# Patient Record
Sex: Male | Born: 1950 | Race: White | Hispanic: No | Marital: Married | Smoking: Never smoker
Health system: Southern US, Community
[De-identification: ages and names within clinical notes are randomized; demographics above are authoritative.]

## PROBLEM LIST (undated history)

## (undated) DIAGNOSIS — I10 Essential (primary) hypertension: Secondary | ICD-10-CM

## (undated) DIAGNOSIS — M109 Gout, unspecified: Secondary | ICD-10-CM

## (undated) HISTORY — DX: Essential (primary) hypertension: I10

---

## 2019-08-02 ENCOUNTER — Emergency Department
Admission: EM | Admit: 2019-08-02 | Discharge: 2019-08-02 | Disposition: A | Discharge status: Home / Self Care | Payer: Medicare HMO | Source: Home / Self Care

## 2019-08-02 ENCOUNTER — Emergency Department: Payer: Medicare HMO

## 2019-08-02 ENCOUNTER — Other Ambulatory Visit: Payer: Self-pay

## 2019-08-02 ENCOUNTER — Emergency Department (INDEPENDENT_AMBULATORY_CARE_PROVIDER_SITE_OTHER): Payer: Medicare HMO

## 2019-08-02 DIAGNOSIS — S92501A Displaced unspecified fracture of right lesser toe(s), initial encounter for closed fracture: Secondary | ICD-10-CM | POA: Diagnosis not present

## 2019-08-02 DIAGNOSIS — S99921A Unspecified injury of right foot, initial encounter: Secondary | ICD-10-CM

## 2019-08-02 NOTE — ED Provider Notes (Signed)
[facility]    CSN: [id number] Arrival date & time: 08/02/19  1212      History   Chief Complaint Chief Complaint  Patient presents with  . Toe Pain    HPI Hank Walling is a 69 y.o. male.   HPI Aladdin Kollmann is a 69 y.o. male presenting to UC with c/o pain, swelling and bruising to Right 2nd toe that occurred two days ago after bending it "in a funny direction" while in the pool.  He has buddy taped his toe, pain is 6/10. His wife encouraged him to be evaluated for a fracture today.   Past Medical History:  Diagnosis Date  . Hypertension     There are no problems to display for this patient.   History reviewed. No pertinent surgical history.     Home Medications    Prior to Admission medications   Medication Sig Start Date End Date Taking? Authorizing Provider  amLODipine (NORVASC) 10 MG tablet Take 10 mg by mouth daily.   Yes [provider]  atorvastatin (LIPITOR) 40 MG tablet Take 40 mg by mouth daily.   Yes [provider]  indomethacin (INDOCIN) 50 MG capsule Take 50 mg by mouth 2 (two) times daily with a meal.   Yes [provider]  losartan (COZAAR) 50 MG tablet Take 50 mg by mouth daily.   Yes [provider]    Family History Family History  Problem Relation Age of Onset  . Healthy Mother   . Hypertension Mother   . Heart attack Father   . Hypertension Father     Social History Social History   Tobacco Use  . Smoking status: Never Smoker  . Smokeless tobacco: Never Used  Substance Use Topics  . Alcohol use: Yes    Alcohol/week: 2.0 standard drinks    Types: 2 Glasses of wine per week  . Drug use: Not on file     Allergies   Patient has no known allergies.   Review of Systems Review of Systems  Musculoskeletal: Positive for arthralgias and joint swelling.  Skin: Positive for color change. Negative for wound.     Physical Exam Triage Vital Signs ED Triage Vitals  Enc Vitals Group       BP 08/02/19 1232 (!) 161/93     Pulse Rate 08/02/19 1232 60     Resp 08/02/19 1232 18     Temp 08/02/19 1232 98.1 F (36.7 C)     Temp Source 08/02/19 1232 Oral     SpO2 08/02/19 1232 100 %     Weight --      Height --      Head Circumference --      Peak Flow --      Pain Score 08/02/19 1229 6     Pain Loc --      Pain Edu? --      Excl. in GC? --    No data found.  Updated Vital Signs BP (!) 161/93 (BP Location: Right Arm)   Pulse 60   Temp 98.1 F (36.7 C) (Oral)   Resp 18   SpO2 100%   Visual Acuity Right Eye Distance:   Left Eye Distance:   Bilateral Distance:    Right Eye Near:   Left Eye Near:    Bilateral Near:     Physical Exam Vitals and nursing note reviewed.  Constitutional:      Appearance: Normal appearance. He is well-developed.  HENT:  Head: Normocephalic and atraumatic.  Cardiovascular:     Rate and Rhythm: Normal rate.  Pulmonary:     Effort: Pulmonary effort is normal.  Musculoskeletal:        General: Swelling and tenderness present. Normal range of motion.     Cervical back: Normal range of motion.     Comments: Right second toe: mild edema, tenderness. No tenderness to metatarsals. Full ROM all toes.  Skin:    General: Skin is warm and dry.     Comments: Right second toe: superficial abrasion to dorsal aspect, surrounding ecchymosis.   Neurological:     Mental Status: He is alert and oriented to person, place, and time.  Psychiatric:        Behavior: Behavior normal.      UC Treatments / Results  Labs (all labs ordered are listed, but only abnormal results are displayed) Labs Reviewed - No data to display  EKG   Radiology DG Foot Complete Right  Result Date: 08/02/2019 CLINICAL DATA:  Toe injury, bruising EXAM: RIGHT FOOT COMPLETE - 3+ VIEW COMPARISON:  None. FINDINGS: Oblique nondisplaced intra-articular fracture of the distal aspect of the right second proximal phalanx. No other fracture or dislocation. Mild first  metatarsophalangeal arthrosis. Soft tissues are unremarkable. IMPRESSION: 1. Oblique nondisplaced intra-articular fracture of the distal aspect of the right second proximal phalanx. No other fracture or dislocation. 2. Mild first metatarsophalangeal arthrosis. Electronically Signed   By: Lauralyn Primes M.D.   On: 08/02/2019 13:16    Procedures Procedures (including critical care time)  Medications Ordered in UC Medications - No data to display  Initial Impression / Assessment and Plan / UC Course  I have reviewed the triage vital signs and the nursing notes.  Pertinent labs & imaging results that were available during my care of the patient were reviewed by me and considered in my medical decision making (see chart for details).    Discussed imaging with pt Toes strapped using buddy taping technique by nursing staff. F/u with PCP or Sports medicine in 1-2 weeks as needed AVS given  Final Clinical Impressions(s) / UC Diagnoses   Final diagnoses:  Injury of right toe, initial encounter  Closed fracture of phalanx of right second toe, initial encounter     Discharge Instructions      You can continue to buddy tape and take tylenol and motrin as needed for pain.   Call to schedule a follow up appointment with Sports Medicine in 1-2 weeks if needed.     ED Prescriptions    None     PDMP not reviewed this encounter.   Lurene Shadow, [place] 08/02/19 1531

## 2019-08-02 NOTE — Discharge Instructions (Signed)
  You can continue to buddy tape and take tylenol and motrin as needed for pain.   Call to schedule a follow up appointment with Sports Medicine in 1-2 weeks if needed.

## 2019-08-02 NOTE — ED Triage Notes (Signed)
Patient presents to Urgent Care with complaints of right second toe pain since bending it a funny direction in the pool two days ago. Patient reports his wife told him to come see if it was broken, pt is able to walk on it, redness and swelling noted. Buddy taped his toes at home.

## 2020-06-22 ENCOUNTER — Other Ambulatory Visit: Payer: Self-pay

## 2020-06-22 ENCOUNTER — Emergency Department (INDEPENDENT_AMBULATORY_CARE_PROVIDER_SITE_OTHER): Payer: Medicare HMO

## 2020-06-22 ENCOUNTER — Emergency Department
Admission: EM | Admit: 2020-06-22 | Discharge: 2020-06-22 | Disposition: A | Discharge status: Home / Self Care | Payer: Medicare HMO | Source: Home / Self Care

## 2020-06-22 ENCOUNTER — Encounter: Payer: Self-pay | Admitting: Emergency Medicine

## 2020-06-22 DIAGNOSIS — W19XXXA Unspecified fall, initial encounter: Secondary | ICD-10-CM

## 2020-06-22 DIAGNOSIS — S6991XA Unspecified injury of right wrist, hand and finger(s), initial encounter: Secondary | ICD-10-CM | POA: Diagnosis not present

## 2020-06-22 DIAGNOSIS — S63501A Unspecified sprain of right wrist, initial encounter: Secondary | ICD-10-CM

## 2020-06-22 NOTE — Discharge Instructions (Signed)
Return if any problems.

## 2020-06-22 NOTE — ED Triage Notes (Signed)
Pt was doing yard work earlier today - went to switch out chainsaw & ladder was knocked out from under pt - pt fell approx 4 ft and landed on R wrist - weakness to R wrist/hand grip since then  (1400)  Tylenol 1000mg  at 

## 2020-06-24 NOTE — ED Provider Notes (Signed)
[facility]    CSN: [id number] Arrival date & time: 06/22/20  1707      History   Chief Complaint Chief Complaint  Patient presents with  . Wrist Injury    HPI Todd Fernandez is a 70 y.o. male.   The history is provided by the patient. No language interpreter was used.  Wrist Injury Location:  Wrist Wrist location:  R wrist Injury: yes   Time since incident:  1 day Pain details:    Quality:  Aching   Radiates to:  Does not radiate   Severity:  Moderate   Onset quality:  Gradual   Timing:  Constant Relieved by:  Nothing Pt reports wrist twisted.  Pt complains of swelling  Past Medical History:  Diagnosis Date  . Hypertension     There are no problems to display for this patient.   History reviewed. No pertinent surgical history.     Home Medications    Prior to Admission medications   Medication Sig Start Date End Date Taking? Authorizing Provider  amLODipine (NORVASC) 10 MG tablet Take 10 mg by mouth daily.   Yes [provider]  losartan (COZAAR) 50 MG tablet Take 50 mg by mouth daily.   Yes [provider]  atorvastatin (LIPITOR) 40 MG tablet Take 40 mg by mouth daily.    [provider]  indomethacin (INDOCIN) 50 MG capsule Take 50 mg by mouth 2 (two) times daily with a meal.    [provider]    Family History Family History  Problem Relation Age of Onset  . Healthy Mother   . Hypertension Mother   . Heart attack Father   . Hypertension Father     Social History Social History   Tobacco Use  . Smoking status: Never Smoker  . Smokeless tobacco: Never Used  Substance Use Topics  . Alcohol use: Yes    Alcohol/week: 2.0 standard drinks    Types: 2 Glasses of wine per week  . Drug use: Never     Allergies   Patient has no known allergies.   Review of Systems Review of Systems  All other systems reviewed and are negative.    Physical Exam Triage Vital Signs ED Triage Vitals  Enc  Vitals Group     BP 06/22/20 1730 (!) 164/90     Pulse Rate 06/22/20 1730 67     Resp 06/22/20 1730 15     Temp 06/22/20 1730 99.3 F (37.4 C)     Temp Source 06/22/20 1730 Oral     SpO2 06/22/20 1730 97 %     Weight --      Height --      Head Circumference --      Peak Flow --      Pain Score 06/22/20 1736 3     Pain Loc --      Pain Edu? --      Excl. in GC? --    No data found.  Updated Vital Signs BP (!) 164/90 (BP Location: Left Arm) Comment: HTN hx - on meds  Pulse 67   Temp 99.3 F (37.4 C) (Oral)   Resp 15   SpO2 97%   Visual Acuity Right Eye Distance:   Left Eye Distance:   Bilateral Distance:    Right Eye Near:   Left Eye Near:    Bilateral Near:     Physical Exam Vitals reviewed.  Musculoskeletal:  General: Swelling and tenderness present.  Skin:    General: Skin is warm.  Neurological:     General: No focal deficit present.     Mental Status: He is alert.  Psychiatric:        Mood and Affect: Mood normal.      UC Treatments / Results  Labs (all labs ordered are listed, but only abnormal results are displayed) Labs Reviewed - No data to display  EKG   Radiology DG Wrist Complete Right  Result Date: 06/22/2020 CLINICAL DATA:  Fall on RIGHT wrist from 4 feet. Range of motion limited by report. EXAM: RIGHT WRIST - COMPLETE 3+ VIEW COMPARISON:  None FINDINGS: Small bone fragments adjacent to the radial styloid. No visible fracture of the radial articular surface or carpal bones. mild undulation of the distal pole the scaphoid may represent a normal variant. Distal radioulnar joint appears intact. No significant soft tissue swelling about the distal forearm. Some soft tissue swelling may be present anterior to the proximal carpal row and distal radius. Mild dorsal tilt of the lunate. IMPRESSION: Small bone fragments adjacent to the radial styloid. This could be the result of an avulsion injury or direct impact. No definitive signs of  articular surface involvement. Mild irregularity along the distal pole of the scaphoid along the radial margin may simply be normal variant. Could consider short interval follow-up if there is point tenderness in this area. Electronically Signed   By: Donzetta Kohut M.D.   On: 06/22/2020 18:13    Procedures Procedures (including critical care time)  Medications Ordered in UC Medications - No data to display  Initial Impression / Assessment and Plan / UC Course  I have reviewed the triage vital signs and the nursing notes.  Pertinent labs & imaging results that were available during my care of the patient were reviewed by me and considered in my medical decision making (see chart for details).     MDM: Pt counseled on wrist sprain.  Pt placed in a splint and advised to follow up with Sports medicine  Final Clinical Impressions(s) / UC Diagnoses   Final diagnoses:  Sprain of right wrist, initial encounter     Discharge Instructions     Return if any problems.     ED Prescriptions    None     PDMP not reviewed this encounter.  An After Visit Summary was printed and given to the patient.    Elson Areas, [place] 06/24/20 1337

## 2020-08-23 ENCOUNTER — Other Ambulatory Visit: Payer: Self-pay

## 2020-08-23 ENCOUNTER — Emergency Department (INDEPENDENT_AMBULATORY_CARE_PROVIDER_SITE_OTHER): Payer: Medicare HMO

## 2020-08-23 ENCOUNTER — Emergency Department
Admission: EM | Admit: 2020-08-23 | Discharge: 2020-08-23 | Disposition: A | Discharge status: Home / Self Care | Payer: Medicare HMO | Source: Home / Self Care | Attending: Family Medicine | Admitting: Family Medicine

## 2020-08-23 DIAGNOSIS — M79641 Pain in right hand: Secondary | ICD-10-CM | POA: Diagnosis not present

## 2020-08-23 DIAGNOSIS — W19XXXA Unspecified fall, initial encounter: Secondary | ICD-10-CM

## 2020-08-23 DIAGNOSIS — S63616A Unspecified sprain of right little finger, initial encounter: Secondary | ICD-10-CM

## 2020-08-23 HISTORY — DX: Gout, unspecified: M10.9

## 2020-08-23 NOTE — Discharge Instructions (Addendum)
Use ice and elevation for the swelling May take ibuprofen for pain Sprained fingers can take months to heal completely

## 2020-08-23 NOTE — ED Triage Notes (Signed)
Pt presents to Urgent Care with c/o R pinky and R lateral hand pain and swelling following a fall last night. States he slipped and fell, landing on his R hand. Pinky is noted to be swollen and bruised.

## 2020-08-23 NOTE — ED Provider Notes (Signed)
[facility]    CSN: [id number] Arrival date & time: 08/23/20  1116      History   Chief Complaint Chief Complaint  Patient presents with   Hand Pain    Mostly to R pinky finger    HPI Todd Fernandez is a 70 y.o. male.   HPI  Patient had a fall in his home last night.  He injured his right hand.  He states it was sore last night and he did not think much of it.  This morning he is noted his fifth finger is swollen, discolored, and it is more painful especially with bending.  He is otherwise uninjured  Past Medical History:  Diagnosis Date   Gout    Hypertension     There are no problems to display for this patient.   History reviewed. No pertinent surgical history.     Home Medications    Prior to Admission medications   Medication Sig Start Date End Date Taking? Authorizing Provider  acetaminophen (TYLENOL) 325 MG tablet Take 650 mg by mouth every 6 (six) hours as needed.   Yes [provider]  amLODipine (NORVASC) 10 MG tablet Take 10 mg by mouth daily.    [provider]  atorvastatin (LIPITOR) 40 MG tablet Take 40 mg by mouth daily.    [provider]  indomethacin (INDOCIN) 50 MG capsule Take 50 mg by mouth 2 (two) times daily as needed. For gout flare-up    [provider]  losartan (COZAAR) 50 MG tablet Take 50 mg by mouth daily.    [provider]    Family History Family History  Problem Relation Age of Onset   Healthy Mother    Hypertension Mother    Heart attack Father    Hypertension Father     Social History Social History   Tobacco Use   Smoking status: Never   Smokeless tobacco: Never  Vaping Use   Vaping Use: Never used  Substance Use Topics   Alcohol use: Not Currently    Alcohol/week: 4.0 standard drinks    Types: 4 Glasses of wine per week    Comment: weekly   Drug use: Never     Allergies   Patient has no known allergies.   Review of Systems Review of Systems See  HPI  Physical Exam Triage Vital Signs ED Triage Vitals  Enc Vitals Group     BP 08/23/20 1133 (!) 169/88     Pulse Rate 08/23/20 1133 65     Resp 08/23/20 1133 18     Temp 08/23/20 1133 99.3 F (37.4 C)     Temp Source 08/23/20 1133 Oral     SpO2 08/23/20 1133 97 %     Weight 08/23/20 1128 190 lb (86.2 kg)     Height 08/23/20 1128 6' (1.829 m)     Head Circumference --      Peak Flow --      Pain Score 08/23/20 1128 5     Pain Loc --      Pain Edu? --      Excl. in GC? --    No data found.  Updated Vital Signs BP (!) 163/89 (BP Location: Right Arm)   Pulse 65   Temp 99.3 F (37.4 C) (Oral)   Resp 18   Ht 6' (1.829 m)   Wt 86.2 kg   SpO2 97%   BMI 25.77 kg/m    Physical Exam Constitutional:  General: He is not in acute distress.    Appearance: He is well-developed.  HENT:     Head: Normocephalic and atraumatic.  Eyes:     Conjunctiva/sclera: Conjunctivae normal.     Pupils: Pupils are equal, round, and reactive to light.  Cardiovascular:     Rate and Rhythm: Normal rate.  Pulmonary:     Effort: Pulmonary effort is normal. No respiratory distress.  Abdominal:     General: There is no distension.     Palpations: Abdomen is soft.  Musculoskeletal:        General: Normal range of motion.     Cervical back: Normal range of motion.     Comments: Fifth finger on right hand is diffusely swollen.  On the palmar aspect ecchymosis is noted in all of the IP creases.  Sensation is normal.  Skin:    General: Skin is warm and dry.  Neurological:     Mental Status: He is alert.       UC Treatments / Results  Labs (all labs ordered are listed, but only abnormal results are displayed) Labs Reviewed - No data to display  EKG   Radiology DG Hand Complete Right  Result Date: 08/23/2020 CLINICAL DATA:  Right hand pain, history of fall EXAM: RIGHT HAND - COMPLETE 3+ VIEW COMPARISON:  X-ray right wrist dated Jun 22, 2020 FINDINGS: No acute fracture or  dislocation. No aggressive-appearing focal osseous lesions. Mild-to-moderate degenerative changes the IP joints. IMPRESSION: No acute osseous abnormality. Electronically Signed   By: Allegra Lai MD   On: 08/23/2020 12:30    Procedures Procedures (including critical care time)  Medications Ordered in UC Medications - No data to display  Initial Impression / Assessment and Plan / UC Course  I have reviewed the triage vital signs and the nursing notes.  Pertinent labs & imaging results that were available during my care of the patient were reviewed by me and considered in my medical decision making (see chart for details).      Final Clinical Impressions(s) / UC Diagnoses   Final diagnoses:  Sprain of right little finger, unspecified site of digit, initial encounter     Discharge Instructions      Use ice and elevation for the swelling May take ibuprofen for pain Sprained fingers can take months to heal completely     ED Prescriptions   None    PDMP not reviewed this encounter.   Eustace Moore, MD 08/23/20 [phone]

## 2022-08-05 IMAGING — DX DG WRIST COMPLETE 3+V*R*
4 series · 4 of 4 positions shown · non-contrast
Comparison: None

CLINICAL DATA: Fall on RIGHT wrist from 4 feet. Range of motion
limited by report.

EXAM:
RIGHT WRIST - COMPLETE 3+ VIEW

[wrist pa]
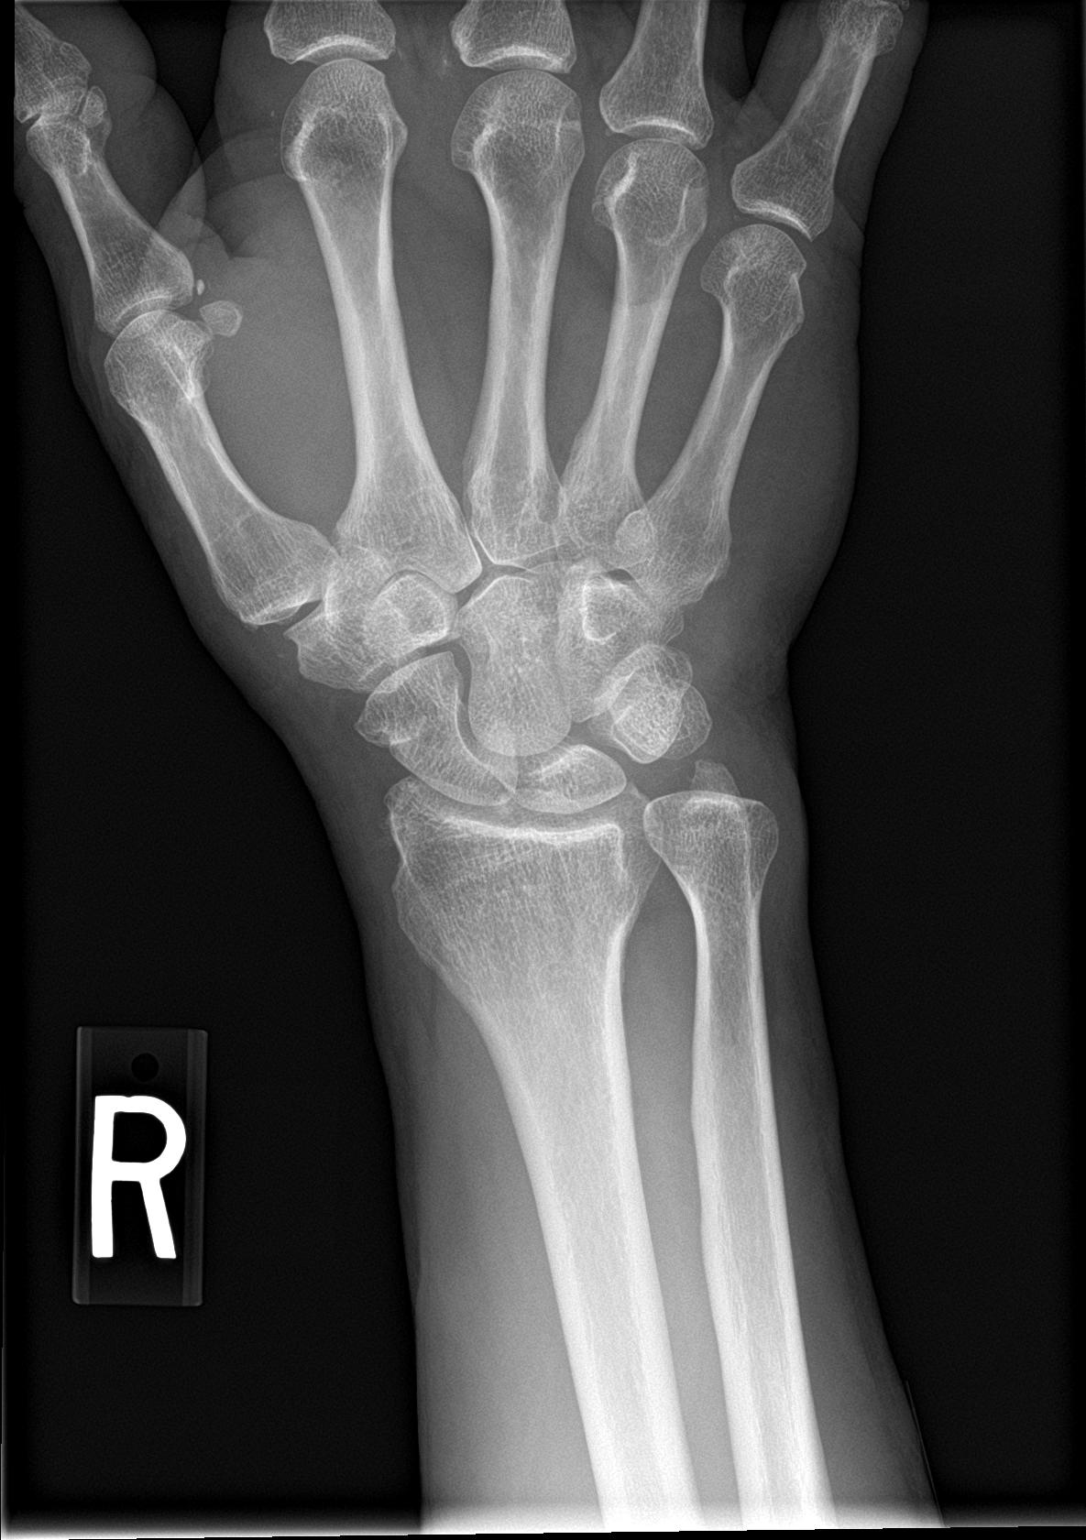

[wrist obl]
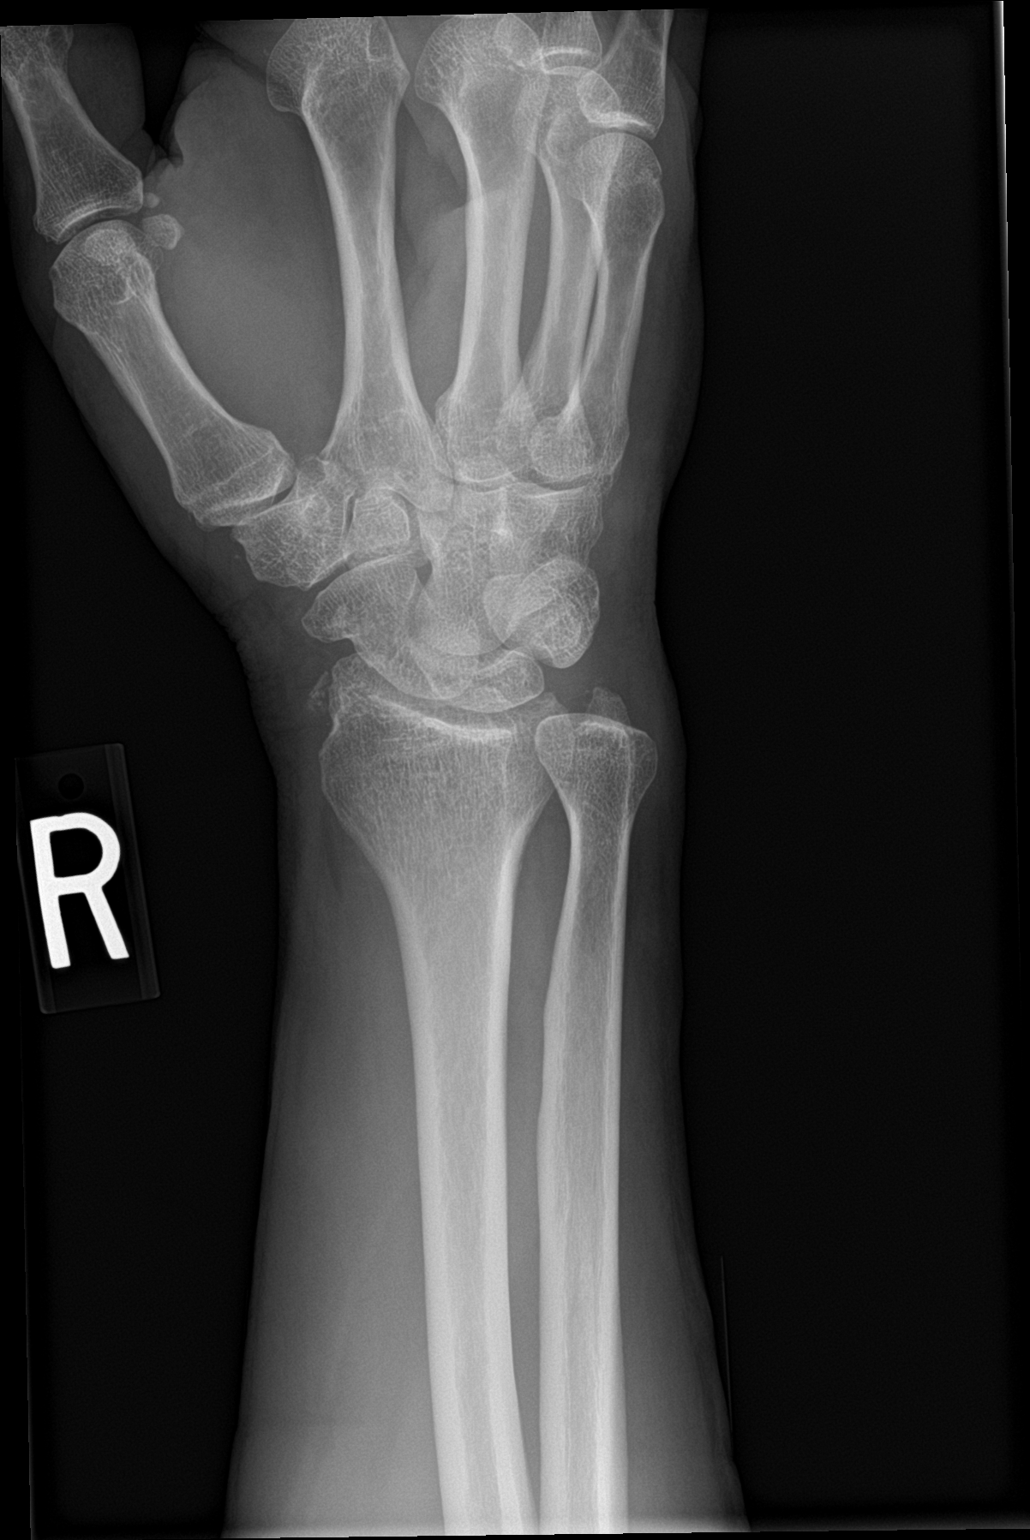

[wrist lat]
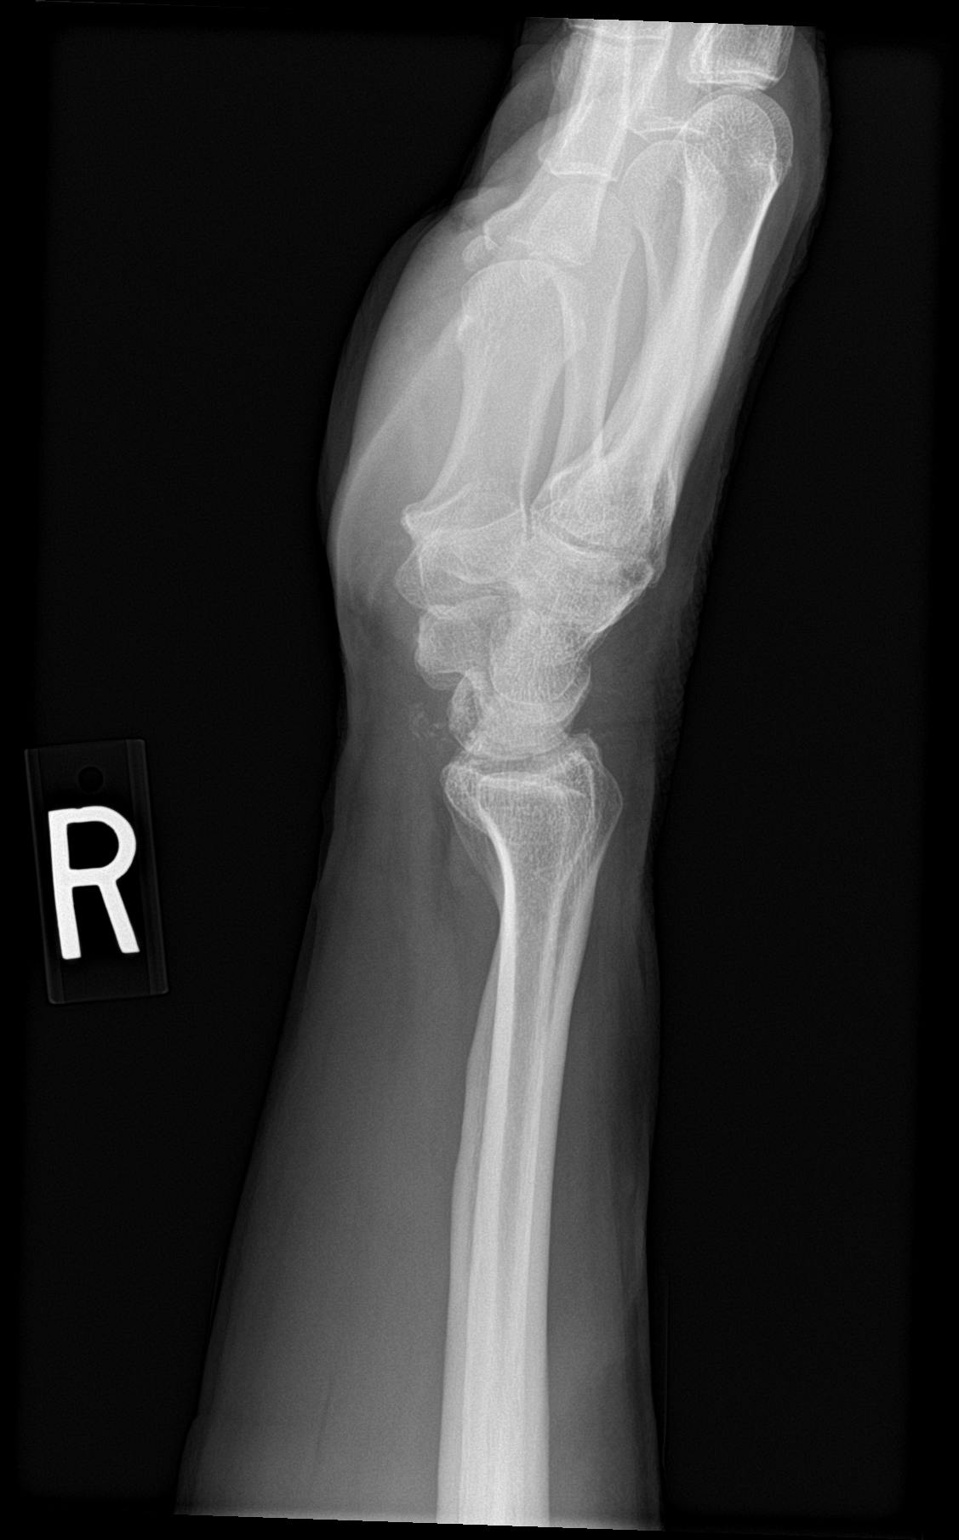

[wrist navicular]
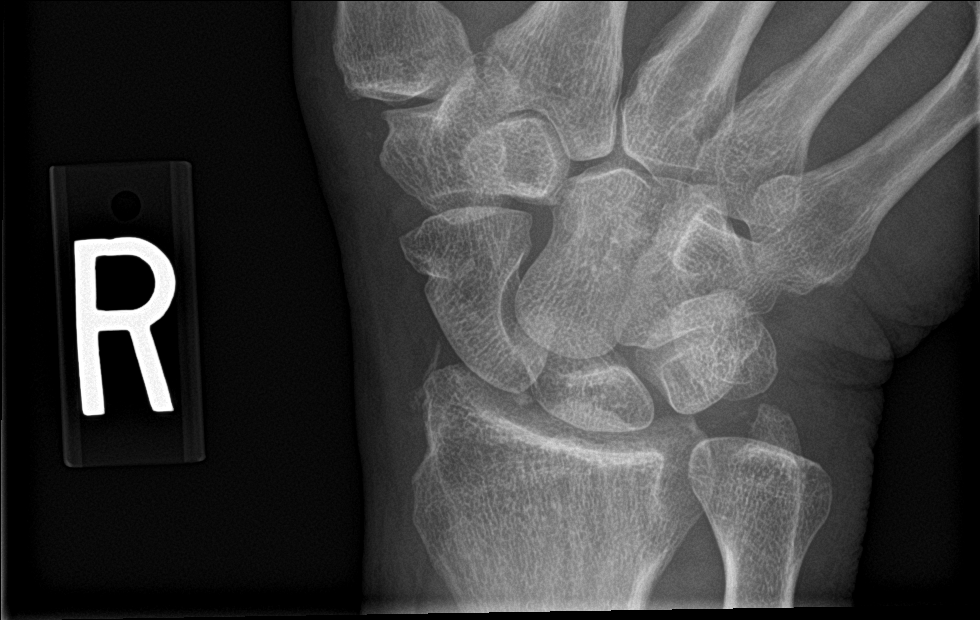

[4 of 4 positions shown; findings below may reference images not displayed]

FINDINGS: Small bone fragments adjacent to the radial styloid. No visible
fracture of the radial articular surface or carpal bones. mild
undulation of the distal pole the scaphoid may represent a normal
variant. Distal radioulnar joint appears intact. No significant soft
tissue swelling about the distal forearm. Some soft tissue swelling
may be present anterior to the proximal carpal row and distal
radius.

Mild dorsal tilt of the lunate.
IMPRESSION: Small bone fragments adjacent to the radial styloid. This could be
the result of an avulsion injury or direct impact. No definitive
signs of articular surface involvement.

Mild irregularity along the distal pole of the scaphoid along the
radial margin may simply be normal variant. Could consider short
interval follow-up if there is point tenderness in this area.

## 2022-09-11 ENCOUNTER — Ambulatory Visit
Admission: EM | Admit: 2022-09-11 | Discharge: 2022-09-11 | Disposition: A | Discharge status: Home / Self Care | Payer: Medicare HMO

## 2022-09-11 DIAGNOSIS — H00011 Hordeolum externum right upper eyelid: Secondary | ICD-10-CM

## 2022-09-11 DIAGNOSIS — H02841 Edema of right upper eyelid: Secondary | ICD-10-CM

## 2022-09-11 MED ORDER — PREDNISONE 20 MG PO TABS: ORAL_TABLET | ORAL | 0 refills | Status: AC

## 2022-09-11 MED ORDER — CEPHALEXIN 500 MG PO CAPS: 500.0000 mg | ORAL_CAPSULE | Freq: Two times a day (BID) | ORAL | 0 refills | Status: AC

## 2022-09-11 NOTE — ED Triage Notes (Signed)
Here for right eye swelling and redness x 2 days.  Pt reports rash around his right cheek x 2 days

## 2022-09-11 NOTE — Discharge Instructions (Addendum)
Advised patient to take medication as directed with food to completion.  Advised patient to take Prednisone with first dose of Keflex for the next 5 of 7 days.  Encouraged to increase daily water intake to 64 ounces per day while taking these medications.  Advised if symptoms worsen and/or unresolved please follow-up with PCP, optometry/ophthalmology, or here for further evaluation.

## 2022-09-11 NOTE — ED Provider Notes (Signed)
[facility]    CSN: [id number] Arrival date & time: 09/11/22  1014      History   Chief Complaint Chief Complaint  Patient presents with   Eye Problem   Rash    HPI Todd Fernandez is a 72 y.o. male.   HPI Pleasant 72 year old male presents with right upper eyelid swelling and redness for 2 days and redness of right cheek for 2 days.  PMH significant for HTN and gout.  Past Medical History:  Diagnosis Date   Gout    Hypertension     There are no problems to display for this patient.   History reviewed. No pertinent surgical history.     Home Medications    Prior to Admission medications   Medication Sig Start Date End Date Taking? Authorizing Provider  acetaminophen (TYLENOL) 325 MG tablet Take 650 mg by mouth every 6 (six) hours as needed.   Yes [provider]  amLODipine (NORVASC) 10 MG tablet Take 10 mg by mouth daily.   Yes [provider]  atorvastatin (LIPITOR) 80 MG tablet Take 80 mg by mouth. 01/08/22  Yes [provider]  cephALEXin (KEFLEX) 500 MG capsule Take 1 capsule (500 mg total) by mouth 2 (two) times daily for 7 days. 09/11/22 09/18/22 Yes Trevor Iha, FNP  losartan (COZAAR) 50 MG tablet Take 50 mg by mouth daily.   Yes [provider]  predniSONE (DELTASONE) 20 MG tablet Take 3 tabs PO daily x 5 days. 09/11/22  Yes Trevor Iha, FNP    Family History Family History  Problem Relation Age of Onset   Healthy Mother    Hypertension Mother    Heart attack Father    Hypertension Father     Social History Social History   Tobacco Use   Smoking status: Never   Smokeless tobacco: Never  Vaping Use   Vaping status: Never Used  Substance Use Topics   Alcohol use: Not Currently    Alcohol/week: 4.0 standard drinks of alcohol    Types: 4 Glasses of wine per week    Comment: weekly   Drug use: Never     Allergies   Patient has no known allergies.   Review of Systems Review of Systems   Eyes:  Positive for redness.  All other systems reviewed and are negative.    Physical Exam Triage Vital Signs ED Triage Vitals [09/11/22 1028]  Encounter Vitals Group     BP (!) 137/90     Systolic BP Percentile      Diastolic BP Percentile      Pulse Rate 67     Resp 16     Temp 98.5 F (36.9 C)     Temp Source Oral     SpO2 97 %     Weight      Height      Head Circumference      Peak Flow      Pain Score      Pain Loc      Pain Education      Exclude from Growth Chart    No data found.  Updated Vital Signs BP (!) 137/90 (BP Location: Left Arm)   Pulse 67   Temp 98.5 F (36.9 C) (Oral)   Resp 16   SpO2 97%    Physical Exam Vitals and nursing note reviewed.  Constitutional:      Appearance: Normal appearance. He is normal weight.  HENT:     Head:  Normocephalic and atraumatic.     Right Ear: Tympanic membrane, ear canal and external ear normal.     Left Ear: Tympanic membrane, ear canal and external ear normal.     Mouth/Throat:     Mouth: Mucous membranes are moist.     Pharynx: Oropharynx is clear.  Eyes:     Extraocular Movements: Extraocular movements intact.     Conjunctiva/sclera: Conjunctivae normal.     Pupils: Pupils are equal, round, and reactive to light.     Comments: Right upper eyelid: moderate soft tissue swelling, erythematous lateral border evident, hordeolum internum noted-please see image below  Cardiovascular:     Rate and Rhythm: Normal rate and regular rhythm.     Pulses: Normal pulses.     Heart sounds: Normal heart sounds.  Pulmonary:     Effort: Pulmonary effort is normal.     Breath sounds: Normal breath sounds. No wheezing, rhonchi or rales.  Musculoskeletal:        General: Normal range of motion.     Cervical back: Normal range of motion and neck supple.  Skin:    General: Skin is warm and dry.  Neurological:     General: No focal deficit present.     Mental Status: He is alert and oriented to person, place, and time.  Mental status is at baseline.  Psychiatric:        Mood and Affect: Mood normal.        Behavior: Behavior normal.      UC Treatments / Results  Labs (all labs ordered are listed, but only abnormal results are displayed) Labs Reviewed - No data to display  EKG   Radiology No results found.  Procedures Procedures (including critical care time)  Medications Ordered in UC Medications - No data to display  Initial Impression / Assessment and Plan / UC Course  I have reviewed the triage vital signs and the nursing notes.  Pertinent labs & imaging results that were available during my care of the patient were reviewed by me and considered in my medical decision making (see chart for details).     MDM: 1.  Hordeolum externum of right upper eyelid-Rx'd Keflex 500 mg capsule twice daily x 7 days; 2.  Swelling of right upper eyelid-Rx'd prednisone 60 mg daily x 5 days. Advised patient to take medication as directed with food to completion.  Advised patient to take Prednisone with first dose of Keflex for the next 5 of 7 days.  Encouraged to increase daily water intake to 64 ounces per day while taking these medications.  Advised if symptoms worsen and/or unresolved please follow-up with PCP, optometry/ophthalmology, or here for further evaluation.  Patient discharged home, hemodynamically stable. Final Clinical Impressions(s) / UC Diagnoses   Final diagnoses:  Swelling of right upper eyelid  Hordeolum externum of right upper eyelid     Discharge Instructions      Advised patient to take medication as directed with food to completion.  Advised patient to take Prednisone with first dose of Keflex for the next 5 of 7 days.  Encouraged to increase daily water intake to 64 ounces per day while taking these medications.  Advised if symptoms worsen and/or unresolved please follow-up with PCP, optometry/ophthalmology, or here for further evaluation.     ED Prescriptions      Medication Sig Dispense Auth. Provider   cephALEXin (KEFLEX) 500 MG capsule Take 1 capsule (500 mg total) by mouth 2 (two) times daily for 7  days. 14 capsule Trevor Iha, FNP   predniSONE (DELTASONE) 20 MG tablet Take 3 tabs PO daily x 5 days. 15 tablet Trevor Iha, FNP      PDMP not reviewed this encounter.   Trevor Iha, FNP 09/11/22 1113
